# Patient Record
Sex: Male | Born: 1990 | Race: Black or African American | Hispanic: No | Marital: Single | Smoking: Never smoker
Health system: Southern US, Community
[De-identification: ages and names within clinical notes are randomized; demographics above are authoritative.]

---

## 2013-11-26 ENCOUNTER — Emergency Department (HOSPITAL_COMMUNITY)
Admission: EM | Admit: 2013-11-26 | Discharge: 2013-11-26 | Disposition: A | Discharge status: Home / Self Care | Payer: No Typology Code available for payment source | Attending: Emergency Medicine | Admitting: Emergency Medicine

## 2013-11-26 ENCOUNTER — Encounter (HOSPITAL_COMMUNITY): Payer: Self-pay | Admitting: Emergency Medicine

## 2013-11-26 ENCOUNTER — Emergency Department (HOSPITAL_COMMUNITY): Payer: No Typology Code available for payment source

## 2013-11-26 DIAGNOSIS — Y9389 Activity, other specified: Secondary | ICD-10-CM | POA: Diagnosis not present

## 2013-11-26 DIAGNOSIS — H9311 Tinnitus, right ear: Secondary | ICD-10-CM | POA: Diagnosis not present

## 2013-11-26 DIAGNOSIS — S299XXA Unspecified injury of thorax, initial encounter: Secondary | ICD-10-CM | POA: Diagnosis present

## 2013-11-26 DIAGNOSIS — S29092A Other injury of muscle and tendon of back wall of thorax, initial encounter: Secondary | ICD-10-CM | POA: Insufficient documentation

## 2013-11-26 DIAGNOSIS — S20211A Contusion of right front wall of thorax, initial encounter: Secondary | ICD-10-CM | POA: Insufficient documentation

## 2013-11-26 DIAGNOSIS — S20312A Abrasion of left front wall of thorax, initial encounter: Secondary | ICD-10-CM

## 2013-11-26 DIAGNOSIS — Y92410 Unspecified street and highway as the place of occurrence of the external cause: Secondary | ICD-10-CM | POA: Insufficient documentation

## 2013-11-26 DIAGNOSIS — S20212A Contusion of left front wall of thorax, initial encounter: Secondary | ICD-10-CM

## 2013-11-26 MED ORDER — IBUPROFEN 800 MG PO TABS
800.0000 mg | ORAL_TABLET | Freq: Once | ORAL | Status: AC
  Administered 2013-11-26: 800 mg via ORAL
  Filled 2013-11-26: qty 1

## 2013-11-26 MED ORDER — NAPROXEN 500 MG PO TABS: 500.0000 mg | ORAL_TABLET | Freq: Two times a day (BID) | ORAL | Status: AC

## 2013-11-26 NOTE — ED Provider Notes (Signed)
Medical screening examination/treatment/procedure(s) were performed by non-physician practitioner and as supervising physician I was immediately available for consultation/collaboration.  Hajira Verhagen T Maurisio Ruddy, MD 11/26/13 2353 
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
## 2013-11-26 NOTE — ED Notes (Signed)
Returned from xray
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
